# Patient Record
Sex: Female | Born: 1945 | Race: White | Hispanic: No | Marital: Married | Smoking: Never smoker
Health system: Southern US, Community
[De-identification: ages and names within clinical notes are randomized; demographics above are authoritative.]

## PROBLEM LIST (undated history)

## (undated) DIAGNOSIS — K219 Gastro-esophageal reflux disease without esophagitis: Secondary | ICD-10-CM

## (undated) DIAGNOSIS — I4891 Unspecified atrial fibrillation: Secondary | ICD-10-CM

## (undated) HISTORY — PX: OTHER SURGICAL HISTORY: SHX169

## (undated) HISTORY — PX: ABDOMINAL HYSTERECTOMY: SHX81

---

## 2020-08-29 ENCOUNTER — Other Ambulatory Visit: Payer: Self-pay

## 2020-08-29 ENCOUNTER — Emergency Department (HOSPITAL_COMMUNITY): Payer: Medicare Other

## 2020-08-29 ENCOUNTER — Encounter (HOSPITAL_COMMUNITY): Payer: Self-pay | Admitting: Emergency Medicine

## 2020-08-29 ENCOUNTER — Observation Stay (HOSPITAL_COMMUNITY)
Admission: EM | Admit: 2020-08-29 | Discharge: 2020-08-30 | Disposition: A | Discharge status: Home / Self Care | Payer: Medicare Other | Attending: Internal Medicine | Admitting: Internal Medicine

## 2020-08-29 DIAGNOSIS — R0602 Shortness of breath: Secondary | ICD-10-CM | POA: Diagnosis not present

## 2020-08-29 DIAGNOSIS — R002 Palpitations: Secondary | ICD-10-CM | POA: Diagnosis present

## 2020-08-29 DIAGNOSIS — Z20822 Contact with and (suspected) exposure to covid-19: Secondary | ICD-10-CM | POA: Insufficient documentation

## 2020-08-29 DIAGNOSIS — Z7901 Long term (current) use of anticoagulants: Secondary | ICD-10-CM | POA: Insufficient documentation

## 2020-08-29 DIAGNOSIS — I480 Paroxysmal atrial fibrillation: Secondary | ICD-10-CM | POA: Diagnosis not present

## 2020-08-29 DIAGNOSIS — E876 Hypokalemia: Secondary | ICD-10-CM

## 2020-08-29 DIAGNOSIS — I4891 Unspecified atrial fibrillation: Secondary | ICD-10-CM

## 2020-08-29 DIAGNOSIS — R7989 Other specified abnormal findings of blood chemistry: Secondary | ICD-10-CM

## 2020-08-29 DIAGNOSIS — R7401 Elevation of levels of liver transaminase levels: Secondary | ICD-10-CM

## 2020-08-29 DIAGNOSIS — K219 Gastro-esophageal reflux disease without esophagitis: Secondary | ICD-10-CM

## 2020-08-29 DIAGNOSIS — R079 Chest pain, unspecified: Secondary | ICD-10-CM

## 2020-08-29 DIAGNOSIS — R778 Other specified abnormalities of plasma proteins: Secondary | ICD-10-CM

## 2020-08-29 DIAGNOSIS — R9431 Abnormal electrocardiogram [ECG] [EKG]: Secondary | ICD-10-CM

## 2020-08-29 HISTORY — DX: Gastro-esophageal reflux disease without esophagitis: K21.9

## 2020-08-29 HISTORY — DX: Unspecified atrial fibrillation: I48.91

## 2020-08-29 LAB — CBC WITH DIFFERENTIAL/PLATELET
Abs Immature Granulocytes: 0.04 10*3/uL (ref 0.00–0.07)
Basophils Absolute: 0.1 10*3/uL (ref 0.0–0.1)
Basophils Relative: 1 %
Eosinophils Absolute: 0.3 10*3/uL (ref 0.0–0.5)
Eosinophils Relative: 3 %
HCT: 41.6 % (ref 36.0–46.0)
Hemoglobin: 13.4 g/dL (ref 12.0–15.0)
Immature Granulocytes: 0 %
Lymphocytes Relative: 34 %
Lymphs Abs: 3.2 10*3/uL (ref 0.7–4.0)
MCH: 31.4 pg (ref 26.0–34.0)
MCHC: 32.2 g/dL (ref 30.0–36.0)
MCV: 97.4 fL (ref 80.0–100.0)
Monocytes Absolute: 1.1 10*3/uL — ABNORMAL HIGH (ref 0.1–1.0)
Monocytes Relative: 12 %
Neutro Abs: 4.6 10*3/uL (ref 1.7–7.7)
Neutrophils Relative %: 50 %
Platelets: 246 10*3/uL (ref 150–400)
RBC: 4.27 MIL/uL (ref 3.87–5.11)
RDW: 13.7 % (ref 11.5–15.5)
WBC: 9.3 10*3/uL (ref 4.0–10.5)
nRBC: 0 % (ref 0.0–0.2)

## 2020-08-29 LAB — TROPONIN I (HIGH SENSITIVITY): Troponin I (High Sensitivity): 11 ng/L (ref <18)

## 2020-08-29 LAB — BASIC METABOLIC PANEL
Anion gap: 10 (ref 5–15)
BUN: 29 mg/dL — ABNORMAL HIGH (ref 8–23)
CO2: 21 mmol/L — ABNORMAL LOW (ref 22–32)
Calcium: 9.1 mg/dL (ref 8.9–10.3)
Chloride: 109 mmol/L (ref 98–111)
Creatinine, Ser: 1.04 mg/dL — ABNORMAL HIGH (ref 0.44–1.00)
GFR, Estimated: 56 mL/min — ABNORMAL LOW (ref >60)
Glucose, Bld: 98 mg/dL (ref 70–99)
Potassium: 3.4 mmol/L — ABNORMAL LOW (ref 3.5–5.1)
Sodium: 140 mmol/L (ref 135–145)

## 2020-08-29 LAB — HEPATIC FUNCTION PANEL
ALT: 79 U/L — ABNORMAL HIGH (ref 0–44)
AST: 70 U/L — ABNORMAL HIGH (ref 15–41)
Albumin: 3.5 g/dL (ref 3.5–5.0)
Alkaline Phosphatase: 78 U/L (ref 38–126)
Bilirubin, Direct: <0.1 mg/dL (ref 0.0–0.2)
Total Bilirubin: 0.3 mg/dL (ref 0.3–1.2)
Total Protein: 7 g/dL (ref 6.5–8.1)

## 2020-08-29 LAB — LIPASE, BLOOD: Lipase: 47 U/L (ref 11–51)

## 2020-08-29 MED ORDER — ALUM & MAG HYDROXIDE-SIMETH 200-200-20 MG/5ML PO SUSP
30.0000 mL | Freq: Once | ORAL | Status: AC
  Administered 2020-08-29: 30 mL via ORAL
  Filled 2020-08-29: qty 30

## 2020-08-29 MED ORDER — LIDOCAINE VISCOUS HCL 2 % MT SOLN
15.0000 mL | Freq: Once | OROMUCOSAL | Status: AC
  Administered 2020-08-29: 15 mL via ORAL
  Filled 2020-08-29: qty 15

## 2020-08-29 MED ORDER — APIXABAN 5 MG PO TABS: 5.0000 mg | ORAL_TABLET | Freq: Two times a day (BID) | ORAL | Status: DC

## 2020-08-29 MED ORDER — DILTIAZEM HCL-DEXTROSE 125-5 MG/125ML-% IV SOLN (PREMIX)
5.0000 mg/h | INTRAVENOUS | Status: DC
  Administered 2020-08-29: 23:00:00 5 mg/h via INTRAVENOUS
  Filled 2020-08-29: qty 125

## 2020-08-29 MED ORDER — DILTIAZEM LOAD VIA INFUSION
10.0000 mg | Freq: Once | INTRAVENOUS | Status: AC
  Administered 2020-08-29: 10 mg via INTRAVENOUS
  Filled 2020-08-29: qty 10

## 2020-08-29 NOTE — ED Triage Notes (Addendum)
Pt c/o chest pain that started about 1 hour ago. Pt states she also noticed that it felt like her Afib was acting up again. Pt states this started after eating dinner tonight. Pt has hx of GERD.  Pt took 324 ASA prior to arrival.

## 2020-08-29 NOTE — ED Provider Notes (Signed)
[facility] EMERGENCY DEPARTMENT Provider Note   CSN: [id number] Arrival date & time: 08/29/20  2240     History Chief Complaint  Patient presents with  . Chest Pain    Angela Strong is a 75 y.o. female.  Patient visiting from [facility].  She has no doctors in the area.  States she was sitting watching television this evening and developed burning pain in the center of her chest similar to previous episodes of GERD.  She also felt palpitations with shortness of breath and nausea.  Felt like her atrial fibrillation was acting up again.  She does have a history of A. fib and takes atenolol twice daily and extra dose tonight.  She is not anticoagulated.  She denies having any known coronary disease.  She still feels her heart racing and burning in the center of her chest.  There are some shortness of breath and nausea.  No abdominal pain or back pain.  No cough or fever.  No leg pain or leg swelling. Burning feels similar to previous episodes of GERD.  Cannot remember the last time she had atrial fibrillation. Is not clear why she is not on anticoagulant  The history is provided by the patient and the EMS personnel.  Chest Pain Associated symptoms: palpitations and shortness of breath   Associated symptoms: no abdominal pain, no cough, no dizziness, no fever, no headache, no numbness and no vomiting        Past Medical History:  Diagnosis Date  . A-fib (HCC)   . GERD (gastroesophageal reflux disease)     There are no problems to display for this patient.   Past Surgical History:  Procedure Laterality Date  . ABDOMINAL HYSTERECTOMY    . c section       OB History   No obstetric history on file.     No family history on file.  Social History   Tobacco Use  . Smoking status: Never Smoker  . Smokeless tobacco: Never Used    Home Medications Prior to Admission medications   Not on File    Allergies    Codeine  Review of Systems   Review of Systems   Constitutional: Negative for activity change, appetite change and fever.  HENT: Negative for congestion and rhinorrhea.   Respiratory: Positive for chest tightness and shortness of breath. Negative for cough.   Cardiovascular: Positive for chest pain and palpitations.  Gastrointestinal: Negative for abdominal pain and vomiting.  Genitourinary: Negative for dysuria and hematuria.  Musculoskeletal: Negative for arthralgias and myalgias.  Skin: Negative for rash and wound.  Neurological: Negative for dizziness, seizures, numbness and headaches.   all other systems are negative except as noted in the HPI and PMH.    Physical Exam Updated Vital Signs BP (!) 151/112   Pulse 85   Temp 97.8 F (36.6 C) (Oral)   Resp 19   Ht 5\' 1"  (1.549 m)   Wt 64.4 kg   SpO2 96%   BMI 26.83 kg/m   Physical Exam Vitals and nursing note reviewed.  Constitutional:      General: She is not in acute distress.    Appearance: She is well-developed.  HENT:     Head: Normocephalic and atraumatic.     Mouth/Throat:     Pharynx: No oropharyngeal exudate.  Eyes:     Conjunctiva/sclera: Conjunctivae normal.     Pupils: Pupils are equal, round, and reactive to light.  Neck:     Comments: No  meningismus. Cardiovascular:     Rate and Rhythm: Tachycardia present. Rhythm irregular.     Heart sounds: Normal heart sounds. No murmur heard.     Comments: Irregular A. fib 110s to 150s Pulmonary:     Effort: Pulmonary effort is normal. No respiratory distress.     Breath sounds: Normal breath sounds.  Abdominal:     Palpations: Abdomen is soft.     Tenderness: There is no abdominal tenderness. There is no guarding or rebound.  Musculoskeletal:        General: No tenderness. Normal range of motion.     Cervical back: Normal range of motion and neck supple.  Skin:    General: Skin is warm.     Capillary Refill: Capillary refill takes less than 2 seconds.  Neurological:     General: No focal deficit present.      Mental Status: She is alert and oriented to person, place, and time. Mental status is at baseline.     Cranial Nerves: No cranial nerve deficit.     Motor: No abnormal muscle tone.     Coordination: Coordination normal.     Comments:  5/5 strength throughout. CN 2-12 intact.Equal grip strength.   Psychiatric:        Behavior: Behavior normal.     ED Results / Procedures / Treatments   Labs (all labs ordered are listed, but only abnormal results are displayed) Labs Reviewed  CBC WITH DIFFERENTIAL/PLATELET - Abnormal; Notable for the following components:      Result Value   Monocytes Absolute 1.1 (*)    All other components within normal limits  BASIC METABOLIC PANEL - Abnormal; Notable for the following components:   Potassium 3.4 (*)    CO2 21 (*)    BUN 29 (*)    Creatinine, Ser 1.04 (*)    GFR, Estimated 56 (*)    All other components within normal limits  BRAIN NATRIURETIC PEPTIDE - Abnormal; Notable for the following components:   B Natriuretic Peptide 110.0 (*)    All other components within normal limits  HEPATIC FUNCTION PANEL - Abnormal; Notable for the following components:   AST 70 (*)    ALT 79 (*)    All other components within normal limits  RESP PANEL BY RT-PCR (FLU A&B, COVID) ARPGX2  LIPASE, BLOOD  TROPONIN I (HIGH SENSITIVITY)  TROPONIN I (HIGH SENSITIVITY)    EKG EKG Interpretation  Date/Time:  Friday August 29 2020 22:47:14 EST Ventricular Rate:  145 PR Interval:    QRS Duration: 81 QT Interval:  308 QTC Calculation: 479 R Axis:   32 Text Interpretation: Atrial fibrillation Anteroseptal infarct, old Repolarization abnormality, prob rate related Baseline wander in lead(s) III aVL aVF No previous ECGs available ST depression laterally Confirmed by Glynn Octave [id number]) on 08/29/2020 10:56:02 PM   Radiology DG Chest Portable 1 View  Result Date: 08/29/2020 CLINICAL DATA:  Chest pain for 1 hour, atrial fibrillation EXAM: PORTABLE CHEST 1  VIEW COMPARISON:  04/28/2020 FINDINGS: The heart size and mediastinal contours are within normal limits. Both lungs are clear. The visualized skeletal structures are unremarkable. IMPRESSION: No active disease. Electronically Signed   By: Sharlet Salina M.D.   On: 08/29/2020 23:15    Procedures .Critical Care Performed by: Glynn Octave, MD Authorized by: Glynn Octave, MD   Critical care provider statement:    Critical care time (minutes):  35   Critical care was necessary to treat or prevent imminent or life-threatening deterioration  of the following conditions: atrial fibrillation with rvr.   Critical care was time spent personally by me on the following activities:  Discussions with consultants, evaluation of patient's response to treatment, examination of patient, ordering and performing treatments and interventions, ordering and review of laboratory studies, ordering and review of radiographic studies, pulse oximetry, re-evaluation of patient's condition, obtaining history from patient or surrogate and review of old charts     Medications Ordered in ED Medications  diltiazem (CARDIZEM) 1 mg/mL load via infusion 10 mg (10 mg Intravenous Bolus from Bag 08/29/20 2311)    And  diltiazem (CARDIZEM) 125 mg in dextrose 5% 125 mL (1 mg/mL) infusion (has no administration in time range)    ED Course  I have reviewed the triage vital signs and the nursing notes.  Pertinent labs & imaging results that were available during my care of the patient were reviewed by me and considered in my medical decision making (see chart for details).    MDM Rules/Calculators/A&P                         Chest burning with palpitations and shortness of breath.  History of atrial fibrillation.  She is in RVR on arrival with ST depressions laterally.  No comparison.  Patient is not anticoagulated.  She feels the palpitations started within the past hour.  Risks and benefits of cardioversion explained with  patient and she declines at this time. She has declined anticoagulation in the past.  We will initiate IV rate control with Cardizem.  Epigastric burning has resolved with GI cocktail.  Patient given IV bolus of Cardizem and a Cardizem drip.  She did convert to sinus rhythm feels improved.  She still has some ST depressions laterally.  Her troponin is negative.  Patient had a reassuring echocardiogram in December 2021. Unknown when her last stress test was. No further chest pain.  Remains in sinus rhythm but ST depressions persist laterally.  Will observe overnight for serial troponins and repeat echocardiogram.  She declines anticoagulation tonight and is aware of the risks of stroke as documented below.  Admission discussed with Dr. Thomes Dinning.   This patients CHA2DS2-VASc Score and unadjusted Ischemic Stroke Rate (% per year) is equal to 2.2 % stroke rate/year from a score of 2  Above score calculated as 1 point each if present [CHF, HTN, DM, Vascular=MI/PAD/Aortic Plaque, Age if 65-74, or Female] Above score calculated as 2 points each if present [Age > 75, or Stroke/TIA/TE]   Final Clinical Impression(s) / ED Diagnoses Final diagnoses:  Atrial fibrillation with RVR (HCC)  Abnormal EKG  Nonspecific chest pain    Rx / DC Orders ED Discharge Orders    None       Paulina Muchmore, Jeannett Senior, MD 08/30/20 [phone]

## 2020-08-30 ENCOUNTER — Encounter (HOSPITAL_COMMUNITY): Payer: Self-pay | Admitting: Internal Medicine

## 2020-08-30 ENCOUNTER — Observation Stay (HOSPITAL_BASED_OUTPATIENT_CLINIC_OR_DEPARTMENT_OTHER): Payer: Medicare Other

## 2020-08-30 DIAGNOSIS — Z20822 Contact with and (suspected) exposure to covid-19: Secondary | ICD-10-CM | POA: Diagnosis not present

## 2020-08-30 DIAGNOSIS — R079 Chest pain, unspecified: Secondary | ICD-10-CM

## 2020-08-30 DIAGNOSIS — E876 Hypokalemia: Secondary | ICD-10-CM

## 2020-08-30 DIAGNOSIS — Z7901 Long term (current) use of anticoagulants: Secondary | ICD-10-CM | POA: Diagnosis not present

## 2020-08-30 DIAGNOSIS — R9431 Abnormal electrocardiogram [ECG] [EKG]: Secondary | ICD-10-CM | POA: Diagnosis not present

## 2020-08-30 DIAGNOSIS — I509 Heart failure, unspecified: Secondary | ICD-10-CM

## 2020-08-30 DIAGNOSIS — R0602 Shortness of breath: Secondary | ICD-10-CM | POA: Diagnosis not present

## 2020-08-30 DIAGNOSIS — R7401 Elevation of levels of liver transaminase levels: Secondary | ICD-10-CM

## 2020-08-30 DIAGNOSIS — I4891 Unspecified atrial fibrillation: Secondary | ICD-10-CM | POA: Diagnosis not present

## 2020-08-30 DIAGNOSIS — I480 Paroxysmal atrial fibrillation: Secondary | ICD-10-CM | POA: Diagnosis not present

## 2020-08-30 DIAGNOSIS — R778 Other specified abnormalities of plasma proteins: Secondary | ICD-10-CM | POA: Diagnosis not present

## 2020-08-30 DIAGNOSIS — R7989 Other specified abnormal findings of blood chemistry: Secondary | ICD-10-CM

## 2020-08-30 DIAGNOSIS — K219 Gastro-esophageal reflux disease without esophagitis: Secondary | ICD-10-CM

## 2020-08-30 LAB — CBC
HCT: 36.3 % (ref 36.0–46.0)
Hemoglobin: 11.7 g/dL — ABNORMAL LOW (ref 12.0–15.0)
MCH: 31.6 pg (ref 26.0–34.0)
MCHC: 32.2 g/dL (ref 30.0–36.0)
MCV: 98.1 fL (ref 80.0–100.0)
Platelets: 192 10*3/uL (ref 150–400)
RBC: 3.7 MIL/uL — ABNORMAL LOW (ref 3.87–5.11)
RDW: 13.8 % (ref 11.5–15.5)
WBC: 7.5 10*3/uL (ref 4.0–10.5)
nRBC: 0 % (ref 0.0–0.2)

## 2020-08-30 LAB — PHOSPHORUS: Phosphorus: 4.5 mg/dL (ref 2.5–4.6)

## 2020-08-30 LAB — PROTIME-INR
INR: 1.1 (ref 0.8–1.2)
Prothrombin Time: 13.6 seconds (ref 11.4–15.2)

## 2020-08-30 LAB — COMPREHENSIVE METABOLIC PANEL
ALT: 66 U/L — ABNORMAL HIGH (ref 0–44)
AST: 57 U/L — ABNORMAL HIGH (ref 15–41)
Albumin: 3.2 g/dL — ABNORMAL LOW (ref 3.5–5.0)
Alkaline Phosphatase: 77 U/L (ref 38–126)
Anion gap: 9 (ref 5–15)
BUN: 33 mg/dL — ABNORMAL HIGH (ref 8–23)
CO2: 22 mmol/L (ref 22–32)
Calcium: 8.9 mg/dL (ref 8.9–10.3)
Chloride: 108 mmol/L (ref 98–111)
Creatinine, Ser: 1.01 mg/dL — ABNORMAL HIGH (ref 0.44–1.00)
GFR, Estimated: 58 mL/min — ABNORMAL LOW (ref >60)
Glucose, Bld: 138 mg/dL — ABNORMAL HIGH (ref 70–99)
Potassium: 4 mmol/L (ref 3.5–5.1)
Sodium: 139 mmol/L (ref 135–145)
Total Bilirubin: 0.3 mg/dL (ref 0.3–1.2)
Total Protein: 6.3 g/dL — ABNORMAL LOW (ref 6.5–8.1)

## 2020-08-30 LAB — ECHOCARDIOGRAM COMPLETE
AR max vel: 2.05 cm2
AV Peak grad: 14.4 mmHg
Ao pk vel: 1.9 m/s
Area-P 1/2: 2.26 cm2
Height: 62 in
S' Lateral: 3.07 cm
Weight: 2370.39 oz

## 2020-08-30 LAB — RESP PANEL BY RT-PCR (FLU A&B, COVID) ARPGX2
Influenza A by PCR: NEGATIVE
Influenza B by PCR: NEGATIVE
SARS Coronavirus 2 by RT PCR: NEGATIVE

## 2020-08-30 LAB — BRAIN NATRIURETIC PEPTIDE: B Natriuretic Peptide: 110 pg/mL — ABNORMAL HIGH (ref 0.0–100.0)

## 2020-08-30 LAB — APTT: aPTT: 32 seconds (ref 24–36)

## 2020-08-30 LAB — MAGNESIUM: Magnesium: 2 mg/dL (ref 1.7–2.4)

## 2020-08-30 LAB — TROPONIN I (HIGH SENSITIVITY)
Troponin I (High Sensitivity): 41 ng/L — ABNORMAL HIGH (ref <18)
Troponin I (High Sensitivity): 246 ng/L (ref <18)
Troponin I (High Sensitivity): 289 ng/L (ref <18)
Troponin I (High Sensitivity): 299 ng/L (ref <18)
Troponin I (High Sensitivity): 257 ng/L (ref <18)

## 2020-08-30 MED ORDER — ENOXAPARIN SODIUM 80 MG/0.8ML ~~LOC~~ SOLN
70.0000 mg | Freq: Two times a day (BID) | SUBCUTANEOUS | Status: DC
  Administered 2020-08-30: 70 mg via SUBCUTANEOUS
  Filled 2020-08-30: qty 0.8

## 2020-08-30 MED ORDER — POTASSIUM CHLORIDE CRYS ER 20 MEQ PO TBCR
40.0000 meq | EXTENDED_RELEASE_TABLET | Freq: Once | ORAL | Status: AC
  Administered 2020-08-30: 40 meq via ORAL
  Filled 2020-08-30: qty 2

## 2020-08-30 MED ORDER — HEPARIN (PORCINE) 25000 UT/250ML-% IV SOLN: 800.0000 [IU]/h | INTRAVENOUS | Status: DC

## 2020-08-30 MED ORDER — APIXABAN 5 MG PO TABS: 5.0000 mg | ORAL_TABLET | Freq: Two times a day (BID) | ORAL | 3 refills | Status: AC

## 2020-08-30 MED ORDER — HEPARIN (PORCINE) 25000 UT/250ML-% IV SOLN: 12.0000 [IU]/kg/h | INTRAVENOUS | Status: DC

## 2020-08-30 MED ORDER — APIXABAN 5 MG PO TABS: 5.0000 mg | ORAL_TABLET | Freq: Two times a day (BID) | ORAL | Status: DC

## 2020-08-30 MED ORDER — PANTOPRAZOLE SODIUM 40 MG PO TBEC
40.0000 mg | DELAYED_RELEASE_TABLET | Freq: Every day | ORAL | Status: DC
  Administered 2020-08-30: 40 mg via ORAL
  Filled 2020-08-30: qty 1

## 2020-08-30 MED ORDER — ATENOLOL 25 MG PO TABS
25.0000 mg | ORAL_TABLET | Freq: Two times a day (BID) | ORAL | Status: DC
  Filled 2020-08-30: qty 1

## 2020-08-30 NOTE — Progress Notes (Signed)
ANTICOAGULATION CONSULT NOTE - Initial Consult  Pharmacy Consult for Lovenox  Indication: atrial fibrillation  Allergies  Allergen Reactions  . Codeine Nausea And Vomiting    Patient Measurements: Height: 5\' 2"  (157.5 cm) Weight: 67.2 kg (148 lb 2.4 oz) IBW/kg (Calculated) : 50.1  Vital Signs: Temp: 98.2 F (36.8 C) (03/12 0215) Temp Source: Oral (03/12 0215) BP: 114/95 (03/12 0215) Pulse Rate: 62 (03/12 0215)  Labs: Recent Labs    08/29/20 2309 08/30/20 0043  HGB 13.4  --   HCT 41.6  --   PLT 246  --   CREATININE 1.04*  --   TROPONINIHS 11 41*    Estimated Creatinine Clearance: 42.6 mL/min (A) (by C-G formula based on SCr of 1.04 mg/dL (H)).   Medical History: Past Medical History:  Diagnosis Date  . A-fib (HCC)   . GERD (gastroesophageal reflux disease)      Assessment: 75 y/o F with afib, has hx of afib but not on anti-coagulation at home, starting Lovenox, CBC/renal function good.   Goal of Therapy:  Monitor platelets by anticoagulation protocol: Yes   Plan:  Lovenox 70 mg subcutaneous q12h Daily CBC Monitor for bleeding  66, PharmD, BCPS Clinical Pharmacist Phone: [phone]

## 2020-08-30 NOTE — Discharge Summary (Signed)
Physician Discharge Summary  Angela Strong Sokolowski UEA:[id number] DOB: 03-22-1946 DOA: 08/29/2020  PCP: Angela Strong, Angela Elizabeth, PA-C  Admit date: 08/29/2020  Discharge date: 08/30/2020  Admitted From:Home  Disposition:  Home  Recommendations for Outpatient Follow-up:  1. Follow up with PCP in 1-2 weeks 2. [facility] 3. Continue on atenolol as prescribed previously 4. Start Eliquis as prescribed for anticoagulation given chads2vasc score of 2  Home Health:None  Equipment/Devices:None  Discharge Condition:Stable  CODE STATUS: Full  Diet recommendation: Heart Healthy  Brief/Interim Summary: Per HPI: Angela Strong Mullenbach is a 75 y.o. female with medical history significant for atrial fibrillation and GERD who presents to the emergency department due to sudden onset of palpitation with nausea and shortness of breath which occurred while she was watching television at home last evening (3/11), this was associated with burning sensation in the mid center of chest that felt like prior episodes of GERD.  Patient states that A. fib was usually well controlled with atenolol twice daily, but she took an extra dose this evening due to sensation of a flareup of A. fib, she decided to go to the ED for further evaluation due to no improvement in palpitations despite the extra dose of atenolol taken. Patient states that she does not take anticoagulants for A. fib because she usually only have a flareup once or twice in a year.  -Patient was initially admitted with concerns for atrial fibrillation with RVR that resolved quickly in the ED after Cardizem bolus as well as brief initiation of IV drip.  She had remained in sinus rhythm, but developed elevated troponin levels in the high 200 range and EKG demonstrated some ST depressions in the lateral leads.  Case was discussed with cardiology Dr. Shari Strong and it appears that much of this was related to demand ischemia in the  setting of her elevated heart rates.  She had normal further complaints of chest pain or shortness of breath while she was hospitalized and had a 2D echocardiogram demonstrating LVEF 60-65% and no wall motion abnormalities.  She has been recommended to start on Eliquis as prescribed in addition to her atenolol and she is noted to be stable for discharge.  Discharge Diagnoses:  Principal Problem:   Paroxysmal atrial fibrillation (HCC) Active Problems:   GERD (gastroesophageal reflux disease)   Abnormal EKG   Hypokalemia   Transaminitis   Elevated brain natriuretic peptide (BNP) level   Elevated troponin  Principal discharge diagnosis: Brief paroxysmal atrial fibrillation with RVR and associated elevated troponin levels related to demand ischemia.  Discharge Instructions  Discharge Instructions    Diet - low sodium heart healthy   Complete by: As directed    Increase activity slowly   Complete by: As directed      Allergies as of 08/30/2020      Reactions   Codeine Nausea And Vomiting      Medication List    TAKE these medications   apixaban 5 MG Tabs tablet Commonly known as: ELIQUIS Take 1 tablet (5 mg total) by mouth 2 (two) times daily.       Follow-up Information    Angela SalenO'Connor, Angela Elizabeth, PA-C. Schedule an appointment as soon as possible for a visit in 2 day(s).   Specialty: Internal Medicine Contact information: [address] [place] [id number] [phone]              Allergies  Allergen Reactions  . Codeine Nausea And Vomiting    Consultations:  Discussed with Cardiology Dr. Shari Prows   Procedures/Studies: DG Chest Portable 1 View  Result Date: 08/29/2020 CLINICAL DATA:  Chest pain for 1 hour, atrial fibrillation EXAM: PORTABLE CHEST 1 VIEW COMPARISON:  04/28/2020 FINDINGS: The heart size and mediastinal contours are within normal limits. Both lungs are clear. The visualized skeletal structures are unremarkable. IMPRESSION: No active  disease. Electronically Signed   By: Sharlet Salina M.D.   On: 08/29/2020 23:15   ECHOCARDIOGRAM COMPLETE  Result Date: 08/30/2020    ECHOCARDIOGRAM REPORT   Patient Name:   Angela Strong Date of Exam: 08/30/2020 Medical Rec #:  [id number]     Height:       62.0 in Accession #:    [id number]    Weight:       148.1 lb Date of Birth:  [birth year]     BSA:          1.683 m Patient Age:    74 years      BP:           150/55 mmHg Patient Gender: F             HR:           56 bpm. [facility] Procedure: 2D Echo, Cardiac Doppler and Color Doppler Indications:    CHF  History:        Patient has no prior history of Echocardiogram examinations.                 Arrythmias:Atrial Fibrillation; Signs/Symptoms:Chest Pain and                 Shortness of Breath.  Sonographer:    Lavenia Atlas RDCS Referring Phys: [id number] OLADAPO ADEFESO  Sonographer Comments: Technically difficult study due to poor echo windows. IMPRESSIONS  1. Left ventricular ejection fraction, by estimation, is 60 to 65%. The left ventricle has normal function. The left ventricle has no regional wall motion abnormalities. There is moderate asymmetric left ventricular hypertrophy of the basal-septal segment. Left ventricular diastolic parameters are indeterminate.  2. Right ventricular systolic function is normal. The right ventricular size is normal. Tricuspid regurgitation signal is inadequate for assessing PA pressure.  3. The mitral valve is normal in structure. Trivial mitral valve regurgitation. No evidence of mitral stenosis.  4. The aortic valve is abnormal. There is moderate calcification of the aortic valve. Aortic valve regurgitation is mild. Mild to moderate aortic valve sclerosis/calcification is present, without any evidence of aortic stenosis.  5. The inferior vena cava is normal in size with <50% respiratory variability, suggesting right atrial pressure of 8 mmHg. Conclusion(s)/Recommendation(s): There is flow acceleration  at the basal septum and basal septum measures 1.5 cm. This study did not demonstrate LVOT obstruction or systolic anterior motion of the mitral valve, however further evaluation can be considered if clinically indicated. FINDINGS  Left Ventricle: Left ventricular ejection fraction, by estimation, is 60 to 65%. The left ventricle has normal function. The left ventricle has no regional wall motion abnormalities. The left ventricular internal cavity size was normal in size. There is  moderate asymmetric left ventricular hypertrophy of the basal-septal segment. Left ventricular diastolic parameters are indeterminate. Right Ventricle: The right ventricular size is normal. No increase in right ventricular wall thickness. Right ventricular systolic function is normal. Tricuspid regurgitation signal is inadequate for assessing PA pressure. Left Atrium: Left atrial size was normal in size. Right Atrium: Right atrial size was normal in size. Pericardium: There is no evidence  of pericardial effusion. Mitral Valve: The mitral valve is normal in structure. Trivial mitral valve regurgitation. No evidence of mitral valve stenosis. Tricuspid Valve: The tricuspid valve is normal in structure. Tricuspid valve regurgitation is trivial. No evidence of tricuspid stenosis. Aortic Valve: The aortic valve is abnormal. There is moderate calcification of the aortic valve. Aortic valve regurgitation is mild. Mild to moderate aortic valve sclerosis/calcification is present, without any evidence of aortic stenosis. Aortic valve peak gradient measures 14.4 mmHg. Pulmonic Valve: The pulmonic valve was not well visualized. Pulmonic valve regurgitation is mild. No evidence of pulmonic stenosis. Aorta: The aortic root is normal in size and structure. Venous: The inferior vena cava is normal in size with less than 50% respiratory variability, suggesting right atrial pressure of 8 mmHg. IAS/Shunts: No atrial level shunt detected by color flow Doppler.   LEFT VENTRICLE PLAX 2D LVIDd:         4.29 cm  Diastology LVIDs:         3.07 cm  LV e' medial:    4.90 cm/s LV PW:         1.13 cm  LV E/e' medial:  18.7 LV IVS:        1.12 cm  LV e' lateral:   5.44 cm/s LVOT diam:     2.00 cm  LV E/e' lateral: 16.9 LV SV:         105 LV SV Index:   62 LVOT Area:     3.14 cm  RIGHT VENTRICLE RV Basal diam:  2.87 cm RV S prime:     9.79 cm/s TAPSE (M-mode): 2.9 cm LEFT ATRIUM             Index       RIGHT ATRIUM          Index LA diam:        3.70 cm 2.20 cm/m  RA Area:     9.70 cm LA Vol (A2C):   37.5 ml 22.28 ml/m RA Volume:   21.20 ml 12.60 ml/m LA Vol (A4C):   33.6 ml 19.97 ml/m LA Biplane Vol: 36.3 ml 21.57 ml/m  AORTIC VALVE AV Area (Vmax): 2.05 cm AV Vmax:        190.00 cm/s AV Peak Grad:   14.4 mmHg LVOT Vmax:      124.00 cm/s LVOT Vmean:     93.000 cm/s LVOT VTI:       0.333 m  AORTA Ao Root diam: 3.00 cm MITRAL VALVE MV Area (PHT): 2.26 cm    SHUNTS MV Decel Time: 336 msec    Systemic VTI:  0.33 m MV E velocity: 91.70 cm/s  Systemic Diam: 2.00 cm MV A velocity: 93.80 cm/s MV E/A ratio:  0.98 Weston Brass MD Electronically signed by Weston Brass MD Signature Date/Time: 08/30/2020/1:33:22 PM    Final       Discharge Exam: Vitals:   08/30/20 0629 08/30/20 1016  BP:  (!) 132/52  Pulse:  (!) 52  Resp: 20 20  Temp: 97.8 F (36.6 C) 98 F (36.7 C)  SpO2:  99%   Vitals:   08/30/20 0615 08/30/20 0619 08/30/20 0629 08/30/20 1016  BP: (!) 150/55 (!) 150/55  (!) 132/52  Pulse: (!) 57 (!) 56  (!) 52  Resp: 19  20 20   Temp:   97.8 F (36.6 C) 98 F (36.7 C)  TempSrc:   Oral Oral  SpO2: 97% 96%  99%  Weight:      Height:  General: Pt is alert, awake, not in acute distress Cardiovascular: RRR, S1/S2 +, no rubs, no gallops Respiratory: CTA bilaterally, no wheezing, no rhonchi Abdominal: Soft, NT, ND, bowel sounds + Extremities: no edema, no cyanosis    The results of significant diagnostics from this hospitalization (including  imaging, microbiology, ancillary and laboratory) are listed below for reference.     Microbiology: Recent Results (from the past 240 hour(s))  Resp Panel by RT-PCR (Flu A&B, Covid) Nasopharyngeal Swab     Status: None   Collection Time: 08/30/20 12:16 AM   Specimen: Nasopharyngeal Swab; Nasopharyngeal(NP) swabs in vial transport medium  Result Value Ref Range Status   SARS Coronavirus 2 by RT PCR NEGATIVE NEGATIVE Final    Comment: (NOTE) SARS-CoV-2 target nucleic acids are NOT DETECTED.  The SARS-CoV-2 RNA is generally detectable in upper respiratory specimens during the acute phase of infection. The lowest concentration of SARS-CoV-2 viral copies this assay can detect is 138 copies/mL. A negative result does not preclude SARS-Cov-2 infection and should not be used as the sole basis for treatment or other patient management decisions. A negative result may occur with  improper specimen collection/handling, submission of specimen other than nasopharyngeal swab, presence of viral mutation(s) within the areas targeted by this assay, and inadequate number of viral copies(<138 copies/mL). A negative result must be combined with clinical observations, patient history, and epidemiological information. The expected result is Negative.  Fact Sheet for Patients:  BloggerCourse.com  Fact Sheet for Healthcare Providers:  SeriousBroker.it  This test is no t yet approved or cleared by the [place] FDA and  has been authorized for detection and/or diagnosis of SARS-CoV-2 by FDA under an Emergency Use Authorization (EUA). This EUA will remain  in effect (meaning this test can be used) for the duration of the COVID-19 declaration under Section 564(b)(1) of the Act, 21 U.S.C.section 360bbb-3(b)(1), unless the authorization is terminated  or revoked sooner.       Influenza A by PCR NEGATIVE NEGATIVE Final   Influenza B by PCR NEGATIVE  NEGATIVE Final    Comment: (NOTE) The Xpert Xpress SARS-CoV-2/FLU/RSV plus assay is intended as an aid in the diagnosis of influenza from Nasopharyngeal swab specimens and should not be used as a sole basis for treatment. Nasal washings and aspirates are unacceptable for Xpert Xpress SARS-CoV-2/FLU/RSV testing.  Fact Sheet for Patients: BloggerCourse.com  Fact Sheet for Healthcare Providers: SeriousBroker.it  This test is not yet approved or cleared by the [place] FDA and has been authorized for detection and/or diagnosis of SARS-CoV-2 by FDA under an Emergency Use Authorization (EUA). This EUA will remain in effect (meaning this test can be used) for the duration of the COVID-19 declaration under Section 564(b)(1) of the Act, 21 U.S.C. section 360bbb-3(b)(1), unless the authorization is terminated or revoked.  Performed at [facility], [address] [place], [place] [address]      Labs: BNP (last 3 results) Recent Labs    08/29/20 2309  BNP 110.0*   Basic Metabolic Panel: Recent Labs  Lab 08/29/20 2309 08/30/20 0428  NA 140 139  K 3.4* 4.0  CL 109 108  CO2 21* 22  GLUCOSE 98 138*  BUN 29* 33*  CREATININE 1.04* 1.01*  CALCIUM 9.1 8.9  MG  --  2.0  PHOS  --  4.5   Liver Function Tests: Recent Labs  Lab 08/29/20 2309 08/30/20 0428  AST 70* 57*  ALT 79* 66*  ALKPHOS 78 77  BILITOT 0.3 0.3  PROT 7.0 6.3*  ALBUMIN 3.5 3.2*   Recent Labs  Lab 08/29/20 2309  LIPASE 47   No results for input(s): AMMONIA in the last 168 hours. CBC: Recent Labs  Lab 08/29/20 2309 08/30/20 0428  WBC 9.3 7.5  NEUTROABS 4.6  --   HGB 13.4 11.7*  HCT 41.6 36.3  MCV 97.4 98.1  PLT 246 192   Cardiac Enzymes: No results for input(s): CKTOTAL, CKMB, CKMBINDEX, TROPONINI in the last 168 hours. BNP: Invalid input(s): POCBNP CBG: No results for input(s): GLUCAP in the last 168 hours. D-Dimer No results for  input(s): DDIMER in the last 72 hours. Hgb A1c No results for input(s): HGBA1C in the last 72 hours. Lipid Profile No results for input(s): CHOL, HDL, LDLCALC, TRIG, CHOLHDL, LDLDIRECT in the last 72 hours. Thyroid function studies No results for input(s): TSH, T4TOTAL, T3FREE, THYROIDAB in the last 72 hours.  Invalid input(s): FREET3 Anemia work up No results for input(s): VITAMINB12, FOLATE, FERRITIN, TIBC, IRON, RETICCTPCT in the last 72 hours. Urinalysis No results found for: COLORURINE, APPEARANCEUR, LABSPEC, PHURINE, GLUCOSEU, HGBUR, BILIRUBINUR, KETONESUR, PROTEINUR, UROBILINOGEN, NITRITE, LEUKOCYTESUR Sepsis Labs Invalid input(s): PROCALCITONIN,  WBC,  LACTICIDVEN Microbiology Recent Results (from the past 240 hour(s))  Resp Panel by RT-PCR (Flu A&B, Covid) Nasopharyngeal Swab     Status: None   Collection Time: 08/30/20 12:16 AM   Specimen: Nasopharyngeal Swab; Nasopharyngeal(NP) swabs in vial transport medium  Result Value Ref Range Status   SARS Coronavirus 2 by RT PCR NEGATIVE NEGATIVE Final    Comment: (NOTE) SARS-CoV-2 target nucleic acids are NOT DETECTED.  The SARS-CoV-2 RNA is generally detectable in upper respiratory specimens during the acute phase of infection. The lowest concentration of SARS-CoV-2 viral copies this assay can detect is 138 copies/mL. A negative result does not preclude SARS-Cov-2 infection and should not be used as the sole basis for treatment or other patient management decisions. A negative result may occur with  improper specimen collection/handling, submission of specimen other than nasopharyngeal swab, presence of viral mutation(s) within the areas targeted by this assay, and inadequate number of viral copies(<138 copies/mL). A negative result must be combined with clinical observations, patient history, and epidemiological information. The expected result is Negative.  Fact Sheet for Patients:   BloggerCourse.com  Fact Sheet for Healthcare Providers:  SeriousBroker.it  This test is no t yet approved or cleared by the [place] FDA and  has been authorized for detection and/or diagnosis of SARS-CoV-2 by FDA under an Emergency Use Authorization (EUA). This EUA will remain  in effect (meaning this test can be used) for the duration of the COVID-19 declaration under Section 564(b)(1) of the Act, 21 U.S.C.section 360bbb-3(b)(1), unless the authorization is terminated  or revoked sooner.       Influenza A by PCR NEGATIVE NEGATIVE Final   Influenza B by PCR NEGATIVE NEGATIVE Final    Comment: (NOTE) The Xpert Xpress SARS-CoV-2/FLU/RSV plus assay is intended as an aid in the diagnosis of influenza from Nasopharyngeal swab specimens and should not be used as a sole basis for treatment. Nasal washings and aspirates are unacceptable for Xpert Xpress SARS-CoV-2/FLU/RSV testing.  Fact Sheet for Patients: BloggerCourse.com  Fact Sheet for Healthcare Providers: SeriousBroker.it  This test is not yet approved or cleared by the [place] FDA and has been authorized for detection and/or diagnosis of SARS-CoV-2 by FDA under an Emergency Use Authorization (EUA). This EUA will remain in effect (meaning this test can be used) for the duration of  the COVID-19 declaration under Section 564(b)(1) of the Act, 21 U.S.C. section 360bbb-3(b)(1), unless the authorization is terminated or revoked.  Performed at [facility], [address], [place], [place] [address]      Time coordinating discharge: 35 minutes  SIGNED:   Erick Blinks, DO [facility] 08/30/2020, 2:10 PM  If 7PM-7AM, please contact night-coverage [email or web address]

## 2020-08-30 NOTE — Progress Notes (Signed)
Trop repeat at 289.Attending Physician Dr. Sherryll Burger made aware. Pt denies any CP. Patient seen by Dr. Arther Abbott in room. Cardiac consult in place. Attending MD informed

## 2020-08-30 NOTE — Progress Notes (Signed)
Lab critical value trop 246. Patient denies any chestpain. VS checked.MD made aware. Cardiology consulted per MD.Echo ordered

## 2020-08-30 NOTE — Progress Notes (Signed)
ANTICOAGULATION CONSULT NOTE - Initial Consult  Pharmacy Consult for heparin gtt  Indication: chest pain/ACS  Allergies  Allergen Reactions  . Codeine Nausea And Vomiting    Patient Measurements: Height: 5\' 2"  (157.5 cm) Weight: 67.2 kg (148 lb 2.4 oz) IBW/kg (Calculated) : 50.1 Heparin Dosing Weight: HEPARIN DW (KG): 64   Vital Signs: Temp: 97.8 F (36.6 C) (03/12 0629) Temp Source: Oral (03/12 0629) BP: 150/55 (03/12 0619) Pulse Rate: 56 (03/12 0619)  Labs: Recent Labs    08/29/20 2309 08/30/20 0428  HGB 13.4 11.7*  HCT 41.6 36.3  PLT 246 192  APTT  --  32  LABPROT  --  13.6  INR  --  1.1  CREATININE 1.04* 1.01*    Estimated Creatinine Clearance: 43.9 mL/min (A) (by C-G formula based on SCr of 1.01 mg/dL (H)).   Medical History: Past Medical History:  Diagnosis Date  . A-fib (HCC)   . GERD (gastroesophageal reflux disease)     Medications:  No medications prior to admission.   Scheduled:  . pantoprazole  40 mg Oral Daily   Infusions:  . diltiazem (CARDIZEM) infusion Stopped (08/30/20 0012)  . heparin     PRN:  Anti-infectives (From admission, onward)   None      Assessment: Arnesia Vincelette a 75 y.o. female requires anticoagulation with a heparin iv infusion for the indication of  chest pain/ACS. Heparin gtt will be started following pharmacy protocol per pharmacy consult. Patient is not on previous oral anticoagulant that will require aPTT/HL correlation before transitioning to only HL monitoring.   Patient was on lovenox 70mg  subq q12h, most recent dose 0612, will begin heparin gtt 12 hours later without bolus  Goal of Therapy:  Heparin level 0.3-0.7 units/ml Monitor platelets by anticoagulation protocol: Yes   Plan:  Start heparin infusion at 800 units/hr Check anti-Xa level in 8 hours and daily while on heparin Continue to monitor H&H and platelets  Heparin level to be drawn in 8 hours for patients >31 years old   Berda Shelvin 08/30/2020,7:35 AM

## 2020-08-30 NOTE — Progress Notes (Signed)
  Echocardiogram 2D Echocardiogram has been performed.  Pieter Partridge 08/30/2020, 8:16 AM

## 2020-08-30 NOTE — Progress Notes (Signed)
Pt has discharge orders. IV removed. Discharge teaching given and no further questions at this time. Pt wheeled down to main entrance to vehicle accompanied by daughter.

## 2020-08-30 NOTE — Progress Notes (Signed)
Date and time results received: 08/30/20 1150am   Test: Troponin Critical Value: 257  Name of Provider Notified: Dr. Cipriano Bunker contacted and informed, no new orders at this time.

## 2020-08-30 NOTE — Progress Notes (Signed)
Date and time results received: 08/30/20  1000 am    Test: Troponin  Critical Value: 299  Name of Provider Notified: Dr. Bud Face via secure chat and amion

## 2020-08-30 NOTE — H&P (Addendum)
History and Physical  Angela Strong VFI:[id number] DOB: 18-Feb-1946 DOA: 08/29/2020  Referring physician: Glynn Octave, MD PCP: Kerin Salen, PA-C  Patient coming from: Home  Chief Complaint: Chest pain  HPI: Angela Strong is a 75 y.o. female with medical history significant for atrial fibrillation and GERD who presents to the emergency department due to sudden onset of palpitation with nausea and shortness of breath which occurred while she was watching television at home last evening (3/11), this was associated with burning sensation in the mid center of chest that felt like prior episodes of GERD.  Patient states that A. fib was usually well controlled with atenolol twice daily, but she took an extra dose this evening due to sensation of a flareup of A. fib, she decided to go to the ED for further evaluation due to no improvement in palpitations despite the extra dose of atenolol taken. Patient states that she does not take anticoagulants for A. fib because she usually only have a flareup once or twice in a year.  ED Course: In the emergency department, she was initially tachycardic and tachypneic, but this improved.  Work-up in the ED showed normal CBC, hypokalemia, troponin x 2- 11 > 41, BNP 110.  AST 70, ALT 79.  Lipase 47.  SARS coronavirus 2 was negative.  Chest x-ray showed no active disease. IV Cardizem 10 mg and IV Cardizem drip was started.  Patient converted to normal sinus rhythm.  GI cocktail given improved patient's burning sensation in the chest.  Hospitalist was asked to admit patient for further evaluation and management.  Review of Systems: Constitutional: Negative for chills and fever.  HENT: Negative for ear pain and sore throat.   Eyes: Negative for pain and visual disturbance.  Respiratory: Positive for shortness of breath.  Negative for cough Cardiovascular: Positive for  burning sensation in chest and palpitations.  Gastrointestinal: Negative for  abdominal pain and vomiting.  Endocrine: Negative for polyphagia and polyuria.  Genitourinary: Negative for decreased urine volume, dysuria, enuresis, hematuria Musculoskeletal: Negative for arthralgias and back pain.  Skin: Negative for color change and rash.  Allergic/Immunologic: Negative for immunocompromised state.  Neurological: Negative for tremors, syncope, speech difficulty, weakness, light-headedness and headaches.  Hematological: Does not bruise/bleed easily.  All other systems reviewed and are negative   Past Medical History:  Diagnosis Date  . A-fib (HCC)   . GERD (gastroesophageal reflux disease)    Past Surgical History:  Procedure Laterality Date  . ABDOMINAL HYSTERECTOMY    . c section      Social History:  reports that she has never smoked. She has never used smokeless tobacco. No history on file for alcohol use and drug use.   Allergies  Allergen Reactions  . Codeine Nausea And Vomiting    No family history on file.    Prior to Admission medications   Not on File    Physical Exam: BP (!) 114/95 (BP Location: Right Arm)   Pulse 62   Temp 98.2 F (36.8 C) (Oral)   Resp 20   Ht 5\' 2"  (1.575 m)   Wt 67.2 kg   SpO2 95%   BMI 27.10 kg/m   . General: 75 y.o. year-old female well developed well nourished in no acute distress.  Alert and oriented x3. 66 HEENT: NCAT, EOMI . Neck: Supple, trachea medial . Cardiovascular: Regular rate and rhythm with no rubs or gallops.  No thyromegaly or JVD noted.  2/4 pulses in all 4 extremities. Marland Kitchen Respiratory: Clear  to auscultation with no wheezes or rales. Good inspiratory effort. . Abdomen: Soft nontender nondistended with normal bowel sounds x4 quadrants. . Muskuloskeletal: No cyanosis, clubbing or edema noted bilaterally . Neuro: CN II-XII intact, strength 5/5 x 4, sensation, reflexes intact . Skin: No ulcerative lesions noted or rashes . Psychiatry: Judgement and insight appear normal. Mood is appropriate for  condition and setting          Labs on Admission:  Basic Metabolic Panel: Recent Labs  Lab 08/29/20 2309  NA 140  K 3.4*  CL 109  CO2 21*  GLUCOSE 98  BUN 29*  CREATININE 1.04*  CALCIUM 9.1   Liver Function Tests: Recent Labs  Lab 08/29/20 2309  AST 70*  ALT 79*  ALKPHOS 78  BILITOT 0.3  PROT 7.0  ALBUMIN 3.5   Recent Labs  Lab 08/29/20 2309  LIPASE 47   No results for input(s): AMMONIA in the last 168 hours. CBC: Recent Labs  Lab 08/29/20 2309  WBC 9.3  NEUTROABS 4.6  HGB 13.4  HCT 41.6  MCV 97.4  PLT 246   Cardiac Enzymes: No results for input(s): CKTOTAL, CKMB, CKMBINDEX, TROPONINI in the last 168 hours.  BNP (last 3 results) Recent Labs    08/29/20 2309  BNP 110.0*    ProBNP (last 3 results) No results for input(s): PROBNP in the last 8760 hours.  CBG: No results for input(s): GLUCAP in the last 168 hours.  Radiological Exams on Admission: DG Chest Portable 1 View  Result Date: 08/29/2020 CLINICAL DATA:  Chest pain for 1 hour, atrial fibrillation EXAM: PORTABLE CHEST 1 VIEW COMPARISON:  04/28/2020 FINDINGS: The heart size and mediastinal contours are within normal limits. Both lungs are clear. The visualized skeletal structures are unremarkable. IMPRESSION: No active disease. Electronically Signed   By: Sharlet Salina M.D.   On: 08/29/2020 23:15    EKG: I independently viewed the EKG done and my findings are as followed: Initial EKG showed A. fib with RVR.  Subsequent EKG showed normal sinus rhythm with ST depression in inferolateral leads  Assessment/Plan Present on Admission: . Paroxysmal atrial fibrillation (HCC)  Principal Problem:   Paroxysmal atrial fibrillation (HCC) Active Problems:   GERD (gastroesophageal reflux disease)   Abnormal EKG   Hypokalemia   Transaminitis   Elevated brain natriuretic peptide (BNP) level  Paroxysmal atrial fibrillation CHA2DS2-VASc score is 2 which is equal to 2.2 % annual risk of  stroke HAS-BLED score = 1 (low risk of major bleeding) Patient was started on IV Cardizem drip, we shall continue with same at this time Patient is not on anticoagulant due to only having paroxysmal A. fib once or twice in a year.  Therapeutic Lovenox will be given at this time  Elevated troponin possibly due to NSTEMI Troponin x2- 11 > 41 > 246 Patient denies chest pain Continue to trend troponin Continue telemetry Cardiology will be consulted   Abnormal EKG Initial EKG personally reviewed showed A. fib with RVR, but subsequent A. fib showed normal sinus rhythm with ST depression in inferolateral leads Considering increased troponin level (which may be due to type II demand ischemia), and elevated BNP it would be reasonable to continue to monitor patient on telemetry.  Repeat EKG in the morning  Hypokalemia K+ 3.4, this will be replenished  Elevated BNP r/o CHF BNP 110, patient appears euvolemic Continue total input/output, daily weights and fluid restriction Continue Cardiac diet  Echocardiogram in the morning   Transaminitis AST 70, ALT 79  Patient denies right upper quadrant pain Continue to monitor liver enzymes  GERD Continue Protonix  DVT prophylaxis: Lovenox  Code Status: Full code  Family Communication: Daughter at bedside (all questions answered to satisfaction)  Disposition Plan:  Patient is from:                        home Anticipated DC to:                   family members home Anticipated DC date:              1 day Anticipated DC barriers:          Patient requires inpatient management of paroxysmal A. fib and to do further work stress to rule out CHF/ACS   Consults called: Cardiology  Admission status: Observation  Frankey Shown MD [facility]  08/30/2020, 3:51 AM

## 2020-08-30 NOTE — Progress Notes (Addendum)
ANTICOAGULATION CONSULT NOTE - Follow Up Consult  Pharmacy Consult for apixaban Indication: atrial fibrillation  Allergies  Allergen Reactions  . Codeine Nausea And Vomiting    Patient Measurements: Height: 5\' 2"  (157.5 cm) Weight: 67.2 kg (148 lb 2.4 oz) IBW/kg (Calculated) : 50.1  Vital Signs: Temp: 97.8 F (36.6 C) (03/12 0629) Temp Source: Oral (03/12 0629) BP: 150/55 (03/12 0619) Pulse Rate: 56 (03/12 0619)  Labs: Recent Labs    08/29/20 2309 08/30/20 0043 08/30/20 0428 08/30/20 0546  HGB 13.4  --  11.7*  --   HCT 41.6  --  36.3  --   PLT 246  --  192  --   APTT  --   --  32  --   LABPROT  --   --  13.6  --   INR  --   --  1.1  --   CREATININE 1.04*  --  1.01*  --   TROPONINIHS 11 41* 246* 289*    Estimated Creatinine Clearance: 43.9 mL/min (A) (by C-G formula based on SCr of 1.01 mg/dL (H)).   Medications:  No medications prior to admission.   Scheduled:  . apixaban  5 mg Oral BID  . atenolol  25 mg Oral BID  . pantoprazole  40 mg Oral Daily   Infusions:   PRN:  Anti-infectives (From admission, onward)   None      Assessment: Patient has afib, previously on lovenox 70mg  subq q12h, now pharmacy consulted for apixaban dosing.    Goal of Therapy:  anticoagulation on apixaban  Monitor platelets by anticoagulation protocol: Yes   Plan:  apixaban 5mg  po bid, starting at 1800 when next dose of lovenox would be due  CBC MWF   10/30/20 Rohaan Durnil 08/30/2020,8:19 AM

## 2020-08-30 NOTE — TOC Transition Note (Signed)
Transition of Care [facility]) - CM/SW Discharge Note   Patient Details  Name: Angela Strong MRN: [id number] Date of Birth: February 20, 1946  [facility] CM/SW Contact:  Barry Brunner, LCSW Phone Number: 08/30/2020, 2:35 PM   Clinical Narrative:    CSW notified of patient's readiness for discharge and need for eliquis voucher. CSW notified Garret with Pharmacy. Garret agreeable to provide voucher. TOC signing off.     Final next level of care: Home/Self Care Barriers to Discharge: Barriers Resolved   Patient Goals and CMS Choice Patient states their goals for this hospitalization and ongoing recovery are:: Return home CMS Medicare.gov Compare Post Acute Care list provided to:: Patient Choice offered to / list presented to : Patient  Discharge Placement                    Patient and family notified of of transfer: 08/30/20  Discharge Plan and Services                DME Arranged: N/A DME Agency: NA       HH Arranged: NA HH Agency: NA        Social Determinants of Health (SDOH) Interventions     Readmission Risk Interventions No flowsheet data found.

## 2020-08-30 NOTE — Progress Notes (Signed)
Date and time results received: 08/30/20 0151  Test: troponin Critical Value: 61  Name of Provider Notified: Dr. Thomes Dinning

## 2022-03-06 IMAGING — DX DG CHEST 1V PORT
1 series · 1 of 1 positions shown · non-contrast
Comparison: 04/28/2020

CLINICAL DATA: Chest pain for 1 hour, atrial fibrillation

EXAM:
PORTABLE CHEST 1 VIEW

[chest ap]
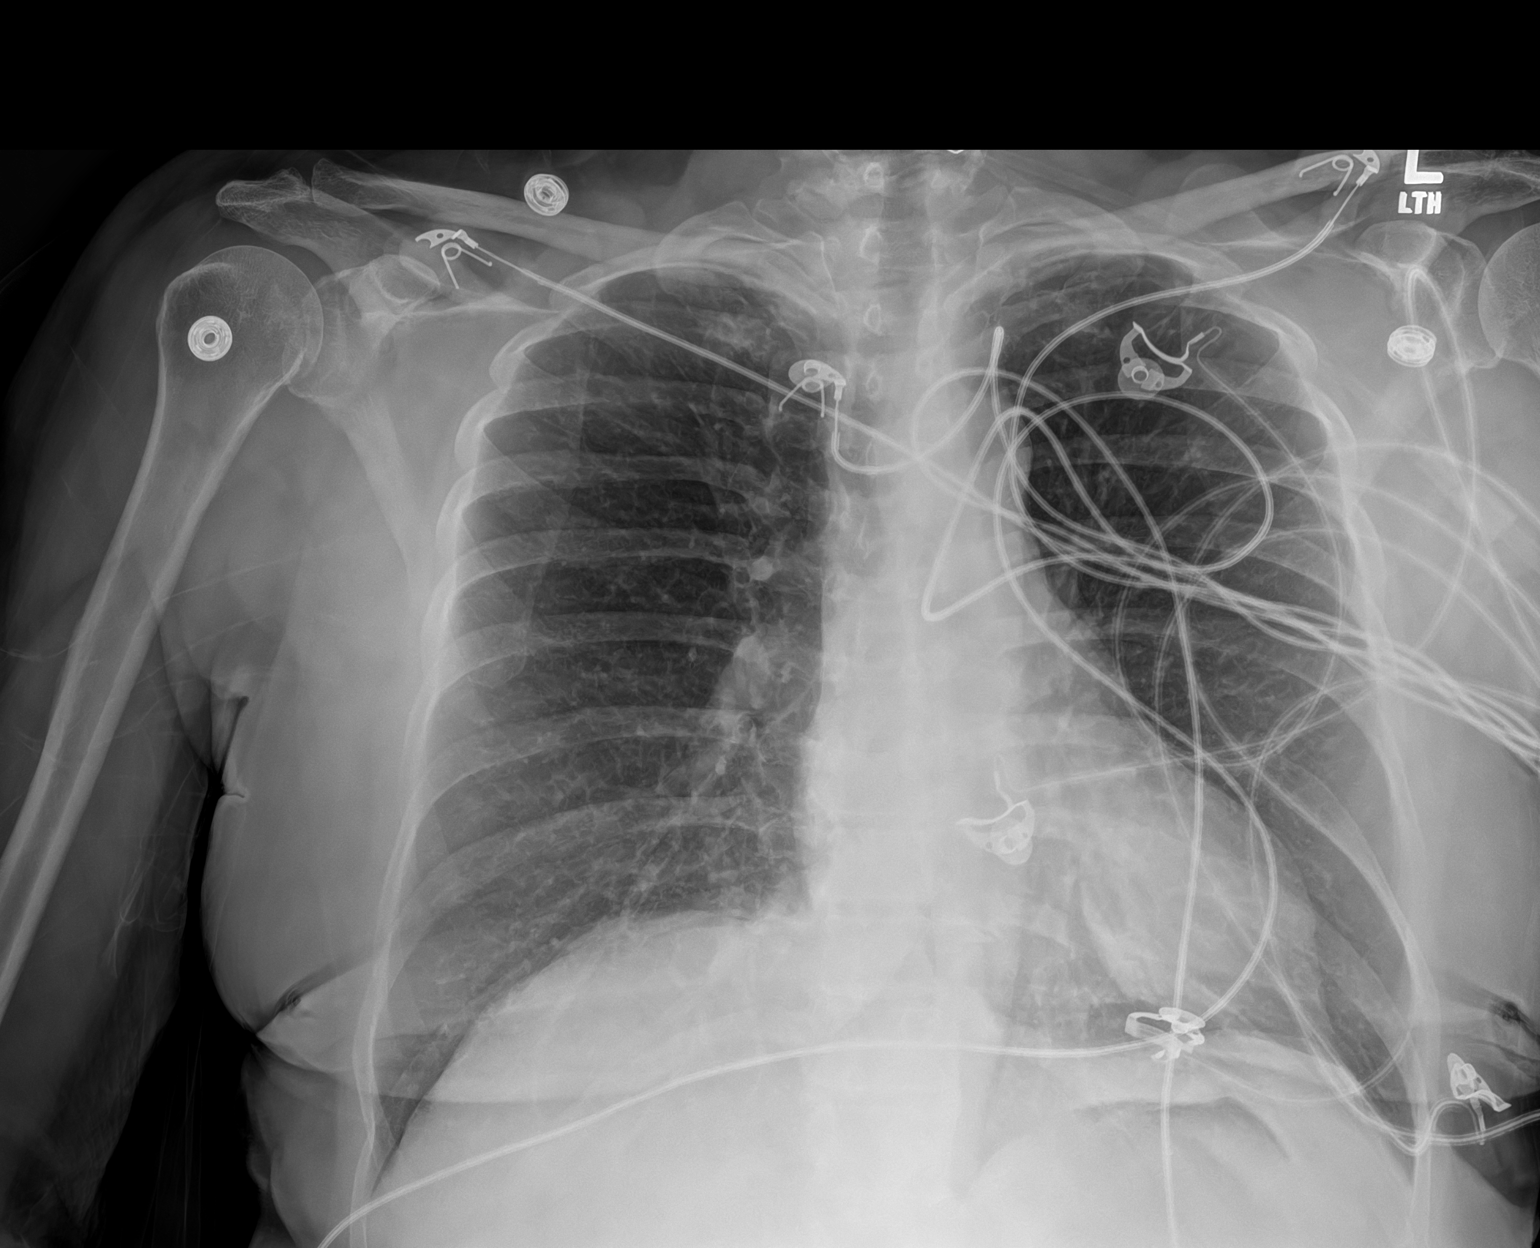

[1 of 1 positions shown; findings below may reference images not displayed]

FINDINGS: The heart size and mediastinal contours are within normal limits.
Both lungs are clear. The visualized skeletal structures are
unremarkable.
IMPRESSION: No active disease.
